# Patient Record
Sex: Male | Born: 1995 | Race: Black or African American | Hispanic: No | Marital: Single | Smoking: Current every day smoker
Health system: Southern US, Community
[De-identification: ages and names within clinical notes are randomized; demographics above are authoritative.]

## PROBLEM LIST (undated history)

## (undated) DIAGNOSIS — Z789 Other specified health status: Secondary | ICD-10-CM

---

## 2018-11-12 ENCOUNTER — Other Ambulatory Visit: Payer: Self-pay

## 2018-11-12 ENCOUNTER — Encounter (HOSPITAL_COMMUNITY): Payer: Self-pay | Admitting: *Deleted

## 2018-11-12 ENCOUNTER — Encounter (HOSPITAL_COMMUNITY): Admission: EM | Disposition: A | Discharge status: Home / Self Care | Payer: Self-pay | Attending: General Surgery

## 2018-11-12 ENCOUNTER — Emergency Department (HOSPITAL_BASED_OUTPATIENT_CLINIC_OR_DEPARTMENT_OTHER)
Admission: EM | Admit: 2018-11-12 | Discharge: 2018-11-12 | Disposition: A | Discharge status: Other Acute Inpatient Hospital | Payer: Worker's Compensation | Source: Home / Self Care | Attending: Emergency Medicine | Admitting: Emergency Medicine

## 2018-11-12 ENCOUNTER — Ambulatory Visit (HOSPITAL_COMMUNITY)
Admission: EM | Admit: 2018-11-12 | Discharge: 2018-11-12 | Disposition: A | Discharge status: Home / Self Care | Payer: Worker's Compensation | Source: Other Acute Inpatient Hospital | Attending: General Surgery | Admitting: General Surgery

## 2018-11-12 ENCOUNTER — Emergency Department (HOSPITAL_BASED_OUTPATIENT_CLINIC_OR_DEPARTMENT_OTHER): Payer: Worker's Compensation

## 2018-11-12 ENCOUNTER — Other Ambulatory Visit: Payer: Self-pay | Admitting: General Surgery

## 2018-11-12 ENCOUNTER — Ambulatory Visit (HOSPITAL_COMMUNITY): Payer: Worker's Compensation | Admitting: Anesthesiology

## 2018-11-12 ENCOUNTER — Encounter (HOSPITAL_BASED_OUTPATIENT_CLINIC_OR_DEPARTMENT_OTHER)
Admission: EM | Disposition: A | Discharge status: Other Acute Inpatient Hospital | Payer: Self-pay | Source: Home / Self Care | Attending: Emergency Medicine

## 2018-11-12 ENCOUNTER — Encounter (HOSPITAL_BASED_OUTPATIENT_CLINIC_OR_DEPARTMENT_OTHER): Payer: Self-pay | Admitting: *Deleted

## 2018-11-12 DIAGNOSIS — Y929 Unspecified place or not applicable: Secondary | ICD-10-CM

## 2018-11-12 DIAGNOSIS — W312XXA Contact with powered woodworking and forming machines, initial encounter: Secondary | ICD-10-CM | POA: Insufficient documentation

## 2018-11-12 DIAGNOSIS — Y999 Unspecified external cause status: Secondary | ICD-10-CM

## 2018-11-12 DIAGNOSIS — W319XXA Contact with unspecified machinery, initial encounter: Secondary | ICD-10-CM | POA: Diagnosis not present

## 2018-11-12 DIAGNOSIS — S62662B Nondisplaced fracture of distal phalanx of right middle finger, initial encounter for open fracture: Secondary | ICD-10-CM

## 2018-11-12 DIAGNOSIS — Y939 Activity, unspecified: Secondary | ICD-10-CM

## 2018-11-12 DIAGNOSIS — S68124A Partial traumatic metacarpophalangeal amputation of right ring finger, initial encounter: Secondary | ICD-10-CM | POA: Diagnosis not present

## 2018-11-12 DIAGNOSIS — F17290 Nicotine dependence, other tobacco product, uncomplicated: Secondary | ICD-10-CM | POA: Diagnosis not present

## 2018-11-12 DIAGNOSIS — M79643 Pain in unspecified hand: Secondary | ICD-10-CM

## 2018-11-12 DIAGNOSIS — S61214A Laceration without foreign body of right ring finger without damage to nail, initial encounter: Secondary | ICD-10-CM | POA: Insufficient documentation

## 2018-11-12 DIAGNOSIS — Z23 Encounter for immunization: Secondary | ICD-10-CM

## 2018-11-12 DIAGNOSIS — S62664B Nondisplaced fracture of distal phalanx of right ring finger, initial encounter for open fracture: Secondary | ICD-10-CM | POA: Insufficient documentation

## 2018-11-12 DIAGNOSIS — S68122A Partial traumatic metacarpophalangeal amputation of right middle finger, initial encounter: Secondary | ICD-10-CM | POA: Insufficient documentation

## 2018-11-12 HISTORY — PX: AMPUTATION: SHX166

## 2018-11-12 HISTORY — DX: Other specified health status: Z78.9

## 2018-11-12 SURGERY — AMPUTATION DIGIT
Anesthesia: Choice | Laterality: Right

## 2018-11-12 SURGERY — AMPUTATION DIGIT
Anesthesia: General | Laterality: Right

## 2018-11-12 MED ORDER — OXYCODONE HCL 5 MG PO TABS: 5.0000 mg | ORAL_TABLET | Freq: Once | ORAL | Status: DC | PRN

## 2018-11-12 MED ORDER — DEXAMETHASONE SODIUM PHOSPHATE 10 MG/ML IJ SOLN
INTRAMUSCULAR | Status: DC | PRN
  Administered 2018-11-12: 5 mg via INTRAVENOUS

## 2018-11-12 MED ORDER — LACTATED RINGERS IV SOLN
INTRAVENOUS | Status: DC
  Administered 2018-11-12: 16:00:00 via INTRAVENOUS

## 2018-11-12 MED ORDER — HYDROCODONE-ACETAMINOPHEN 5-325 MG PO TABS: 1.0000 | ORAL_TABLET | ORAL | 0 refills | Status: AC | PRN

## 2018-11-12 MED ORDER — CHLORHEXIDINE GLUCONATE 4 % EX LIQD: 60.0000 mL | Freq: Once | CUTANEOUS | Status: DC

## 2018-11-12 MED ORDER — ONDANSETRON HCL 4 MG/2ML IJ SOLN
INTRAMUSCULAR | Status: DC | PRN
  Administered 2018-11-12: 4 mg via INTRAVENOUS

## 2018-11-12 MED ORDER — PROMETHAZINE HCL 25 MG/ML IJ SOLN: 6.2500 mg | INTRAMUSCULAR | Status: DC | PRN

## 2018-11-12 MED ORDER — BACITRACIN ZINC 500 UNIT/GM EX OINT
TOPICAL_OINTMENT | CUTANEOUS | Status: AC
  Filled 2018-11-12: qty 28.35

## 2018-11-12 MED ORDER — CEFAZOLIN SODIUM 1 G IJ SOLR
INTRAMUSCULAR | Status: AC
  Filled 2018-11-12: qty 20

## 2018-11-12 MED ORDER — POVIDONE-IODINE 10 % EX SWAB: 2.0000 "application " | Freq: Once | CUTANEOUS | Status: DC

## 2018-11-12 MED ORDER — DEXAMETHASONE SODIUM PHOSPHATE 10 MG/ML IJ SOLN
INTRAMUSCULAR | Status: AC
  Filled 2018-11-12: qty 1

## 2018-11-12 MED ORDER — TETANUS-DIPHTH-ACELL PERTUSSIS 5-2.5-18.5 LF-MCG/0.5 IM SUSP
INTRAMUSCULAR | Status: AC
  Administered 2018-11-12: 0.5 mL via INTRAMUSCULAR
  Filled 2018-11-12: qty 0.5

## 2018-11-12 MED ORDER — EPHEDRINE SULFATE-NACL 50-0.9 MG/10ML-% IV SOSY
PREFILLED_SYRINGE | INTRAVENOUS | Status: DC | PRN
  Administered 2018-11-12: 10 mg via INTRAVENOUS

## 2018-11-12 MED ORDER — LIDOCAINE 2% (20 MG/ML) 5 ML SYRINGE
INTRAMUSCULAR | Status: AC
  Filled 2018-11-12: qty 5

## 2018-11-12 MED ORDER — MEPERIDINE HCL 50 MG/ML IJ SOLN: 6.2500 mg | INTRAMUSCULAR | Status: DC | PRN

## 2018-11-12 MED ORDER — HYDROMORPHONE HCL 1 MG/ML IJ SOLN: 0.2500 mg | INTRAMUSCULAR | Status: DC | PRN

## 2018-11-12 MED ORDER — BUPIVACAINE HCL (PF) 0.5 % IJ SOLN
INTRAMUSCULAR | Status: DC | PRN
  Administered 2018-11-12: 10 mL

## 2018-11-12 MED ORDER — FENTANYL CITRATE (PF) 250 MCG/5ML IJ SOLN
INTRAMUSCULAR | Status: AC
  Filled 2018-11-12: qty 5

## 2018-11-12 MED ORDER — EPHEDRINE 5 MG/ML INJ
INTRAVENOUS | Status: AC
  Filled 2018-11-12: qty 10

## 2018-11-12 MED ORDER — CEFAZOLIN SODIUM-DEXTROSE 1-4 GM/50ML-% IV SOLN
1.0000 g | Freq: Once | INTRAVENOUS | Status: AC
  Administered 2018-11-12: 1 g via INTRAVENOUS
  Filled 2018-11-12: qty 50

## 2018-11-12 MED ORDER — FENTANYL CITRATE (PF) 250 MCG/5ML IJ SOLN
INTRAMUSCULAR | Status: DC | PRN
  Administered 2018-11-12 (×3): 50 ug via INTRAVENOUS

## 2018-11-12 MED ORDER — FENTANYL CITRATE (PF) 100 MCG/2ML IJ SOLN
INTRAMUSCULAR | Status: AC
  Administered 2018-11-12: 100 ug via INTRAVENOUS
  Filled 2018-11-12: qty 2

## 2018-11-12 MED ORDER — PROPOFOL 10 MG/ML IV BOLUS
INTRAVENOUS | Status: DC | PRN
  Administered 2018-11-12: 200 mg via INTRAVENOUS

## 2018-11-12 MED ORDER — CEFAZOLIN SODIUM-DEXTROSE 2-4 GM/100ML-% IV SOLN
2.0000 g | INTRAVENOUS | Status: AC
  Administered 2018-11-12: 2 g via INTRAVENOUS

## 2018-11-12 MED ORDER — FENTANYL CITRATE (PF) 100 MCG/2ML IJ SOLN
50.0000 ug | Freq: Once | INTRAMUSCULAR | Status: AC
  Administered 2018-11-12: 100 ug via INTRAVENOUS

## 2018-11-12 MED ORDER — MIDAZOLAM HCL 2 MG/2ML IJ SOLN
INTRAMUSCULAR | Status: DC | PRN
  Administered 2018-11-12: 2 mg via INTRAVENOUS

## 2018-11-12 MED ORDER — ONDANSETRON HCL 4 MG/2ML IJ SOLN
INTRAMUSCULAR | Status: AC
  Filled 2018-11-12: qty 2

## 2018-11-12 MED ORDER — LIDOCAINE 2% (20 MG/ML) 5 ML SYRINGE
INTRAMUSCULAR | Status: DC | PRN
  Administered 2018-11-12: 50 mg via INTRAVENOUS

## 2018-11-12 MED ORDER — BUPIVACAINE HCL 0.5 % IJ SOLN
INTRAMUSCULAR | Status: AC
  Filled 2018-11-12: qty 1

## 2018-11-12 MED ORDER — CEPHALEXIN 500 MG PO CAPS: 500.0000 mg | ORAL_CAPSULE | Freq: Four times a day (QID) | ORAL | 0 refills | Status: AC

## 2018-11-12 MED ORDER — OXYCODONE HCL 5 MG/5ML PO SOLN: 5.0000 mg | Freq: Once | ORAL | Status: DC | PRN

## 2018-11-12 MED ORDER — MIDAZOLAM HCL 2 MG/2ML IJ SOLN
INTRAMUSCULAR | Status: AC
  Filled 2018-11-12: qty 2

## 2018-11-12 MED ORDER — PROPOFOL 10 MG/ML IV BOLUS
INTRAVENOUS | Status: AC
  Filled 2018-11-12: qty 20

## 2018-11-12 SURGICAL SUPPLY — 36 items
BANDAGE ACE 4X5 VEL STRL LF (GAUZE/BANDAGES/DRESSINGS) ×3 IMPLANT
BNDG COHESIVE 1X5 TAN STRL LF (GAUZE/BANDAGES/DRESSINGS) ×3 IMPLANT
BNDG CONFORM 2 STRL LF (GAUZE/BANDAGES/DRESSINGS) ×3 IMPLANT
BNDG ESMARK 4X9 LF (GAUZE/BANDAGES/DRESSINGS) ×3 IMPLANT
BNDG GAUZE ELAST 4 BULKY (GAUZE/BANDAGES/DRESSINGS) ×3 IMPLANT
CHLORAPREP W/TINT 26ML (MISCELLANEOUS) ×3 IMPLANT
CORDS BIPOLAR (ELECTRODE) ×3 IMPLANT
COVER WAND RF STERILE (DRAPES) ×3 IMPLANT
DRSG ADAPTIC 3X8 NADH LF (GAUZE/BANDAGES/DRESSINGS) ×3 IMPLANT
DRSG XEROFORM 1X8 (GAUZE/BANDAGES/DRESSINGS) ×3 IMPLANT
ELECT REM PT RETURN 9FT ADLT (ELECTROSURGICAL)
ELECTRODE REM PT RTRN 9FT ADLT (ELECTROSURGICAL) IMPLANT
GAUZE SPONGE 4X4 12PLY STRL (GAUZE/BANDAGES/DRESSINGS) ×3 IMPLANT
GAUZE XEROFORM 1X8 LF (GAUZE/BANDAGES/DRESSINGS) ×3 IMPLANT
GLOVE BIOGEL M 8.0 STRL (GLOVE) ×3 IMPLANT
GOWN STRL REUS W/ TWL LRG LVL3 (GOWN DISPOSABLE) ×1 IMPLANT
GOWN STRL REUS W/ TWL XL LVL3 (GOWN DISPOSABLE) ×1 IMPLANT
GOWN STRL REUS W/TWL LRG LVL3 (GOWN DISPOSABLE) ×2
GOWN STRL REUS W/TWL XL LVL3 (GOWN DISPOSABLE) ×2
KIT BASIN OR (CUSTOM PROCEDURE TRAY) ×3 IMPLANT
KIT TURNOVER KIT B (KITS) ×3 IMPLANT
NS IRRIG 1000ML POUR BTL (IV SOLUTION) ×3 IMPLANT
PACK ORTHO EXTREMITY (CUSTOM PROCEDURE TRAY) ×3 IMPLANT
PAD ARMBOARD 7.5X6 YLW CONV (MISCELLANEOUS) ×6 IMPLANT
PAD CAST 4YDX4 CTTN HI CHSV (CAST SUPPLIES) ×1 IMPLANT
PADDING CAST COTTON 4X4 STRL (CAST SUPPLIES) ×2
SOAP 2 % CHG 4 OZ (WOUND CARE) ×3 IMPLANT
SPONGE LAP 18X18 RF (DISPOSABLE) ×3 IMPLANT
SPONGE LAP 4X18 RFD (DISPOSABLE) ×3 IMPLANT
SUT ETHILON 4 0 PS 2 18 (SUTURE) ×6 IMPLANT
TOWEL OR 17X24 6PK STRL BLUE (TOWEL DISPOSABLE) ×3 IMPLANT
TOWEL OR 17X26 10 PK STRL BLUE (TOWEL DISPOSABLE) ×3 IMPLANT
TUBE CONNECTING 12'X1/4 (SUCTIONS)
TUBE CONNECTING 12X1/4 (SUCTIONS) IMPLANT
WATER STERILE IRR 1000ML POUR (IV SOLUTION) ×3 IMPLANT
YANKAUER SUCT BULB TIP NO VENT (SUCTIONS) IMPLANT

## 2018-11-12 NOTE — ED Notes (Signed)
Patient transported to X-ray 

## 2018-11-12 NOTE — Anesthesia Procedure Notes (Signed)
Procedure Name: LMA Insertion Date/Time: 11/12/2018 4:13 PM Performed by: De Nurse, CRNA Pre-anesthesia Checklist: Patient identified, Emergency Drugs available, Suction available and Patient being monitored Patient Re-evaluated:Patient Re-evaluated prior to induction Oxygen Delivery Method: Circle System Utilized Preoxygenation: Pre-oxygenation with 100% oxygen Induction Type: IV induction Ventilation: Mask ventilation without difficulty LMA: LMA inserted LMA Size: 4.0 Number of attempts: 1 Placement Confirmation: positive ETCO2 Tube secured with: Tape Dental Injury: Teeth and Oropharynx as per pre-operative assessment

## 2018-11-12 NOTE — ED Notes (Signed)
Pt needs UDS for work

## 2018-11-12 NOTE — Discharge Instructions (Signed)
Discharge Instructions:  Keep your dressing clean, dry and in place until instructed to remove by Dr. Woodfin Kiss.  If the dressing becomes dirty or wet call the office for instructions during business hours. Elevate the extremity to help with swelling, this will also help with any discomfort. Take your medication as prescribed. No lifting with the injured  extremity. If you feel that the dressing is too tight, you may loosen it, but keep it on; finger tips should be pink; if there is a concern, call the office. [phone] Ice may be used if the injury is a fracture, do not apply ice directly to the skin. Please call the office on the next business day after discharge to arrange a follow up appointment.  Call [phone] between the hours of 9am - 5pm M-Th or 9am - 1pm on Fri. For most hand injuries and/or conditions, you may return to work using the uninjured hand (one handed duty) within 24-72 hours.  A detailed note will be provided to you at your follow up appointment or may contact the office prior to your follow up.    

## 2018-11-12 NOTE — ED Provider Notes (Addendum)
[facility] EMERGENCY DEPARTMENT Provider Note   CSN: [id number] Arrival date & time: 11/12/18  0825    History   Chief Complaint Chief Complaint  Patient presents with  . Laceration    HPI Logan Faulkner is a 23 y.o. male.     The history is provided by the patient and medical records. No language interpreter was used.  Hand Pain  This is a new problem. The current episode started 1 to 2 hours ago. The problem occurs constantly. The problem has not changed since onset.Pertinent negatives include no chest pain, no abdominal pain, no headaches and no shortness of breath. Nothing aggravates the symptoms. Nothing relieves the symptoms. He has tried nothing for the symptoms. The treatment provided no relief.    History reviewed. No pertinent past medical history.  There are no active problems to display for this patient.   History reviewed. No pertinent surgical history.      Home Medications    Prior to Admission medications   Not on File    Family History No family history on file.  Social History Social History   Tobacco Use  . Smoking status: Not on file  Substance Use Topics  . Alcohol use: Not on file  . Drug use: Not on file     Allergies   Patient has no known allergies.   Review of Systems Review of Systems  Constitutional: Negative for activity change, chills, diaphoresis, fatigue and fever.  HENT: Negative for congestion and rhinorrhea.   Eyes: Negative for visual disturbance.  Respiratory: Negative for cough, chest tightness, shortness of breath, wheezing and stridor.   Cardiovascular: Negative for chest pain, palpitations and leg swelling.  Gastrointestinal: Negative for abdominal distention, abdominal pain, blood in stool, constipation, diarrhea, nausea and vomiting.  Genitourinary: Negative for difficulty urinating, dysuria, flank pain and frequency.  Musculoskeletal: Negative for back pain, gait problem, neck pain and neck  stiffness.  Skin: Positive for wound. Negative for rash.  Neurological: Negative for dizziness, weakness, light-headedness, numbness and headaches.  Psychiatric/Behavioral: Negative for agitation.  All other systems reviewed and are negative.    Physical Exam Updated Vital Signs BP (!) 162/108 (BP Location: Left Arm)   Pulse 61   Temp 97.7 F (36.5 C) (Oral)   Resp 16   Ht 6' (1.829 m)   Wt 68 kg   SpO2 100%   BMI 20.34 kg/m   Physical Exam Vitals signs and nursing note reviewed.  Constitutional:      Appearance: He is well-developed.  HENT:     Head: Normocephalic and atraumatic.     Nose: No congestion or rhinorrhea.     Mouth/Throat:     Pharynx: No oropharyngeal exudate or posterior oropharyngeal erythema.  Eyes:     Conjunctiva/sclera: Conjunctivae normal.     Pupils: Pupils are equal, round, and reactive to light.  Neck:     Musculoskeletal: Neck supple.  Cardiovascular:     Rate and Rhythm: Normal rate and regular rhythm.     Heart sounds: No murmur.  Pulmonary:     Effort: Pulmonary effort is normal. No respiratory distress.     Breath sounds: Normal breath sounds.  Abdominal:     Palpations: Abdomen is soft.     Tenderness: There is no abdominal tenderness.  Musculoskeletal:        General: Tenderness present. No swelling.     Right hand: He exhibits tenderness, deformity and laceration. Normal sensation noted. Normal strength noted.  Hands:     Right lower leg: No edema.     Left lower leg: No edema.  Skin:    General: Skin is warm and dry.     Capillary Refill: Capillary refill takes less than 2 seconds.     Findings: No erythema.  Neurological:     General: No focal deficit present.     Mental Status: He is alert and oriented to person, place, and time.           ED Treatments / Results  Labs (all labs ordered are listed, but only abnormal results are displayed) Labs Reviewed - No data to display  EKG None  Radiology Dg Hand  Complete Right  Result Date: 11/12/2018 CLINICAL DATA:  Pt chopped off tip of the 3rd and 4th fingers today in a wood chopper EXAM: RIGHT HAND - COMPLETE 3+ VIEW COMPARISON:  None. FINDINGS: There has been amputation of the distal aspect of the third digit, transecting the distal phalanx. There has been amputation of the soft tissues and probably the distal tuft of the fourth distal phalanx. IMPRESSION: Amputation of the distal aspect of the third and fourth digits. Electronically Signed   By: Norva Pavlov M.D.   On: 11/12/2018 09:29    Procedures Procedures (including critical care time)  Medications Ordered in ED Medications  fentaNYL (SUBLIMAZE) injection 50 mcg (100 mcg Intravenous Given 11/12/18 0929)  Tdap (BOOSTRIX) 5-2.5-18.5 LF-MCG/0.5 injection (0.5 mLs Intramuscular Given 11/12/18 0932)  ceFAZolin (ANCEF) IVPB 1 g/50 mL premix (0 g Intravenous Stopped 11/12/18 1015)     Initial Impression / Assessment and Plan / ED Course  I have reviewed the triage vital signs and the nursing notes.  Pertinent labs & imaging results that were available during my care of the patient were reviewed by me and considered in my medical decision making (see chart for details).        Logan Faulkner is a right handed 23 y.o. male with no significant past medical history who presents with right finger injury.  Patient reports that he was using a chop saw today when there was a piece of wood stuck in the saw.  He reports that he typically would use another piece of wood to dislodge it but this always off so he reached in with his right hand.  He is right-handed.  He then reports he bumped the on button with his body causing the sought to engage and sliced off the tip of his third and fourth digit on the right hand.  He reports immediate onset of bleeding that was stopped with pressure.  He reports severe pain.  No other injuries reported.  No other preceding symptoms.  He was unable to recover the tips of his  fingers.  On exam, lungs clear chest nontender.  Abdomen nontender.  Patient has laceration and tip amputation of the third and fourth digits on the right hand.  Patient had normal sensation in the rest of the finger.  Minimal tenderness in the rest of the hand.  No snuffbox tenderness.  Normal radial and ulnar pulse.  Exam otherwise unremarkable.  There did appear to be exposed bone in the digits.  X-rays were obtained and patient was given tetanus update and Ancef for likely open fracture.  X-rays show amputation as well as open fracture and transection of the distal phalanx of the third digit and probable distal tuft of the fourth digit distal phalanx.  Patient was given pain medicine for severe pain.  Dr. Izora Ribas with hand surgery was called and he request patient go  to [facility] to the short stay area to have surgery this afternoon.  As patient has received narcotic pain medication, do not feel he will be appropriate for driving himself over there.  Will discuss transfer options with patient.  Patient has someone that can give him a ride.  Patient will remain n.p.o. and will go straight to the short stay area, and without our transfer service.  Patient understood he needs to go directly there.  Patient understood return precautions and follow-up instructions.  Patient transferred in stable condition by personal vehicle.   Final Clinical Impressions(s) / ED Diagnoses   Final diagnoses:  Laceration of right ring finger without foreign body, nail damage status unspecified, initial encounter  Contact with powered saw as cause of accidental injury  Open nondisplaced fracture of distal phalanx of right middle finger, initial encounter  Open nondisplaced fracture of distal phalanx of right ring finger, initial encounter    Clinical Impression: 1. Laceration of right ring finger without foreign body, nail damage status unspecified, initial encounter   2. Hand pain   3. Contact with  powered saw as cause of accidental injury   4. Open nondisplaced fracture of distal phalanx of right middle finger, initial encounter   5. Open nondisplaced fracture of distal phalanx of right ring finger, initial encounter     Disposition: Transfer to [facility] short stay for operative management of right hand laceration.  Accepting physician is Dr. Izora Ribas in the short stay area.   This note was prepared with assistance of Conservation officer, historic buildings. Occasional wrong-word or sound-a-like substitutions may have occurred due to the inherent limitations of voice recognition software.     Dmauri Rosenow, Canary Brim, MD 11/12/18 1120    Odel Schmid, Canary Brim, MD 11/12/18 1124

## 2018-11-12 NOTE — Anesthesia Postprocedure Evaluation (Signed)
Anesthesia Post Note  Patient: Logan Faulkner  Procedure(s) Performed: REVISION OF AMPUTATION RIGHT 3RD & 4TH DGIT (Right )     Patient location during evaluation: PACU Anesthesia Type: General Level of consciousness: awake and alert Pain management: pain level controlled Vital Signs Assessment: post-procedure vital signs reviewed and stable Respiratory status: spontaneous breathing, nonlabored ventilation, respiratory function stable and patient connected to nasal cannula oxygen Cardiovascular status: blood pressure returned to baseline and stable Postop Assessment: no apparent nausea or vomiting Anesthetic complications: no    Last Vitals:  Vitals:   11/12/18 1735 11/12/18 1741  BP: (!) 137/91 134/85  Pulse: 66 (!) 55  Resp: (!) 23   Temp: (!) 36.4 C   SpO2: 100% 100%    Last Pain:  Vitals:   11/12/18 1741  TempSrc:   PainSc: 0-No pain                 Ronelle Michie COKER

## 2018-11-12 NOTE — H&P (Signed)
Reason for Consult:finger injury Referring Physician: ER  CC:I cut my ingers off  HPI:  Logan Faulkner is an 23 y.o. right handed male who presents with   Finger tip amputation of RLF, RRF from a chopping device at work.  Pt stuck hand into the machine, accidentally turned it on and finger tips were amputated.  Does not have finger tips.  Pt presented to ER, transferred here for OR     .   Pain is rated at    9/10 and is described as sharp.  Pain is constant.  Pain is made better by rest/immobilization, worse with motion.   Associated signs/symptoms:none Previous treatment:    Past Medical History:  Diagnosis Date  . Medical history non-contributory     History reviewed. No pertinent surgical history.  History reviewed. No pertinent family history.  Social History:  reports that he has been smoking cigars. He has never used smokeless tobacco. He reports previous alcohol use. He reports current drug use. Drug: Marijuana.  Allergies: No Known Allergies  Medications: I have reviewed the patient's current medications.  No results found for this or any previous visit (from the past 48 hour(s)).  Dg Hand Complete Right  Result Date: 11/12/2018 CLINICAL DATA:  Pt chopped off tip of the 3rd and 4th fingers today in a wood chopper EXAM: RIGHT HAND - COMPLETE 3+ VIEW COMPARISON:  None. FINDINGS: There has been amputation of the distal aspect of the third digit, transecting the distal phalanx. There has been amputation of the soft tissues and probably the distal tuft of the fourth distal phalanx. IMPRESSION: Amputation of the distal aspect of the third and fourth digits. Electronically Signed   By: Norva Pavlov M.D.   On: 11/12/2018 09:29    Pertinent items are noted in HPI. Temp:  [97.7 F (36.5 C)-98.4 F (36.9 C)] 98.4 F (36.9 C) (03/02 1303) Pulse Rate:  [61-90] 62 (03/02 1303) Resp:  [16-18] 18 (03/02 1303) BP: (155-165)/(91-108) 165/97 (03/02 1328) SpO2:  [99 %-100 %] 100 %  (03/02 1303) Weight:  [68 kg] 68 kg (03/02 1325) General appearance: alert and cooperative Resp: clear to auscultation bilaterally Cardio: regular rate and rhythm GI: soft, non-tender; bowel sounds normal; no masses,  no organomegaly Extremities: extremities normal, atraumatic, no cyanosis or edema Ex ept for amputation at dipj of RLF, amputation thru nail matrix RRF  Assessment: Amputation of RRF,RLF tips Plan: Will need revision amputations I have discussed this treatment plan in detail with patient , including the risks of the recommended treatment or surgery, the benefits and the alternatives.  The patient caregiver understands that additional treatment may be necessary.  Ved Martos C Parisha Beaulac 11/12/2018, 4:11 PM

## 2018-11-12 NOTE — Anesthesia Preprocedure Evaluation (Signed)
Anesthesia Evaluation  Patient identified by MRN, date of birth, ID band Patient awake    Reviewed: Allergy & Precautions, NPO status , Patient's Chart, lab work & pertinent test results  Airway Mallampati: I  TM Distance: >3 FB Neck ROM: Full    Dental  (+) Dental Advisory Given, Teeth Intact   Pulmonary neg pulmonary ROS,    Pulmonary exam normal breath sounds clear to auscultation       Cardiovascular negative cardio ROS Normal cardiovascular exam Rhythm:Regular Rate:Normal     Neuro/Psych negative neurological ROS  negative psych ROS   GI/Hepatic negative GI ROS, Neg liver ROS,   Endo/Other  negative endocrine ROS  Renal/GU negative Renal ROS     Musculoskeletal negative musculoskeletal ROS (+)   Abdominal   Peds  Hematology negative hematology ROS (+)   Anesthesia Other Findings   Reproductive/Obstetrics                             Anesthesia Physical Anesthesia Plan  ASA: I  Anesthesia Plan: General   Post-op Pain Management:    Induction: Intravenous  PONV Risk Score and Plan: 3 and Ondansetron, Dexamethasone, Midazolam and Treatment may vary due to age or medical condition  Airway Management Planned: LMA and Oral ETT  Additional Equipment: None  Intra-op Plan:   Post-operative Plan: Extubation in OR  Informed Consent: I have reviewed the patients History and Physical, chart, labs and discussed the procedure including the risks, benefits and alternatives for the proposed anesthesia with the patient or authorized representative who has indicated his/her understanding and acceptance.     Dental advisory given  Plan Discussed with: CRNA  Anesthesia Plan Comments:         Anesthesia Quick Evaluation

## 2018-11-12 NOTE — Transfer of Care (Signed)
Immediate Anesthesia Transfer of Care Note  Patient: Logan Faulkner  Procedure(s) Performed: REVISION OF AMPUTATION RIGHT 3RD & 4TH DGIT (Right )  Patient Location: PACU  Anesthesia Type:General  Level of Consciousness: lethargic and responds to stimulation  Airway & Oxygen Therapy: Patient Spontanous Breathing  Post-op Assessment: Report given to RN  Post vital signs: Reviewed and stable  Last Vitals:  Vitals Value Taken Time  BP    Temp    Pulse 66 11/12/2018  5:04 PM  Resp 23 11/12/2018  5:05 PM  SpO2 100 % 11/12/2018  5:04 PM  Vitals shown include unvalidated device data.  Last Pain:  Vitals:   11/12/18 1320  TempSrc:   PainSc: 4       Patients Stated Pain Goal: 3 (11/12/18 1320)  Complications: No apparent anesthesia complications

## 2018-11-12 NOTE — ED Triage Notes (Signed)
Tips of 3rd and 4th finger rt hand cut w chop swaw  Bleeding controlled

## 2018-11-13 ENCOUNTER — Encounter (HOSPITAL_COMMUNITY): Payer: Self-pay | Admitting: General Surgery

## 2018-11-13 NOTE — Op Note (Signed)
NAME: YSMAEL, SALINGER MEDICAL RECORD EX:[id number] ACCOUNT [id number] DATE OF BIRTH:03/25/1996 FACILITY: [facility] LOCATION: [facility] PHYSICIAN:Eldridge Marcott C. Gwen Edler, MD  OPERATIVE REPORT  DATE OF PROCEDURE:  11/12/2018  PREOPERATIVE DIAGNOSIS:  Fingertip amputation of the right long finger and right ring finger.  POSTOPERATIVE DIAGNOSIS:  Fingertip amputation of the right long finger and right ring finger.  PROCEDURE:   1.  Revision amputation of the right ring finger with neurectomies and local advancement flap closure. 2.  A preparation of the right ring finger for full thickness skin graft. 3.  Harvest of full-thickness skin graft from the right ulnar mid lateral hand. 4.  Placement of full thickness skin graft to defect of the right ring fingertip.  INDICATIONS:  The patient is a 23 year old gentleman who had his finger was amputated at work this afternoon with some type of chopping machine.  Risks, benefits and alternatives of revision amputation were discussed with the patient who agreed with  these.  Consent was obtained.  DESCRIPTION OF PROCEDURE:  The patient was taken to the operating room and placed supine on the operating room table.  Anesthesia was administered.  Timeout was performed.  Preoperative antibiotics were given.  The right upper extremity was prepped and  draped in normal sterile fashion.  An Esmarch was used on the forearm for a tourniquet.  The long finger was addressed first.  The amputation was just distal to the nail fold and distal to the DIP joint.  The wound was thoroughly cleansed with saline  solution.  The skin edges were trimmed.  Full thickness skin, subcutaneous tissue, bone were debrided neurectomies were performed.  Hemostasis was performed with bipolar cautery.  The volar part of the flap was rotated dorsally and sutured in place with  multiple interrupted 4-0 nylon sutures for a nice repair.  Next, the ring finger was addressed.  The defect was  approximately 1.2 cm x 1.  The very distal portion of the tuft had been amputated, but otherwise, there was good soft tissue coverage and most  of the nail matrix was intact.  The skin and subcutaneous tissue was sharply debrided.  Bipolar was used to control bleeding.  A small elliptical pattern was chosen on the ulnar side of the mid lateral portion of the hand.  A 1.5 cm x 1 cm ellipse was  made.  Full thickness skin was harvested.  Hemostasis was obtained.  This defect was gently undermined and closed tension-free with multiple interrupted 4-0 nylon sutures.  The graft was defatted, pie crusted and placed over the defect and sutured  circumferentially with a 4-0 nylon for a nice repair.  The Esmarch was released.  Hemostasis was complete.  Sterile dressings were placed over each finger as well as the graft donor site.    The patient tolerated the procedure well.  AN/NUANCE  D:11/12/2018 T:11/13/2018 JOB:[id number]

## 2021-04-06 ENCOUNTER — Emergency Department (HOSPITAL_BASED_OUTPATIENT_CLINIC_OR_DEPARTMENT_OTHER): Payer: Self-pay

## 2021-04-06 ENCOUNTER — Emergency Department (HOSPITAL_BASED_OUTPATIENT_CLINIC_OR_DEPARTMENT_OTHER)
Admission: EM | Admit: 2021-04-06 | Discharge: 2021-04-06 | Disposition: A | Discharge status: Home / Self Care | Payer: Self-pay | Attending: Emergency Medicine | Admitting: Emergency Medicine

## 2021-04-06 ENCOUNTER — Other Ambulatory Visit: Payer: Self-pay

## 2021-04-06 ENCOUNTER — Encounter (HOSPITAL_BASED_OUTPATIENT_CLINIC_OR_DEPARTMENT_OTHER): Payer: Self-pay | Admitting: *Deleted

## 2021-04-06 DIAGNOSIS — F12188 Cannabis abuse with other cannabis-induced disorder: Secondary | ICD-10-CM | POA: Insufficient documentation

## 2021-04-06 DIAGNOSIS — Z79899 Other long term (current) drug therapy: Secondary | ICD-10-CM | POA: Insufficient documentation

## 2021-04-06 DIAGNOSIS — F1290 Cannabis use, unspecified, uncomplicated: Secondary | ICD-10-CM

## 2021-04-06 DIAGNOSIS — F17290 Nicotine dependence, other tobacco product, uncomplicated: Secondary | ICD-10-CM | POA: Insufficient documentation

## 2021-04-06 DIAGNOSIS — R112 Nausea with vomiting, unspecified: Secondary | ICD-10-CM

## 2021-04-06 LAB — RAPID URINE DRUG SCREEN, HOSP PERFORMED
Amphetamines: NOT DETECTED
Barbiturates: NOT DETECTED
Benzodiazepines: NOT DETECTED
Cocaine: NOT DETECTED
Opiates: NOT DETECTED
Tetrahydrocannabinol: POSITIVE — AB

## 2021-04-06 LAB — COMPREHENSIVE METABOLIC PANEL
ALT: 15 U/L (ref 0–44)
AST: 25 U/L (ref 15–41)
Albumin: 4.9 g/dL (ref 3.5–5.0)
Alkaline Phosphatase: 109 U/L (ref 38–126)
Anion gap: 3 — ABNORMAL LOW (ref 5–15)
BUN: 13 mg/dL (ref 6–20)
CO2: 23 mmol/L (ref 22–32)
Calcium: 8.4 mg/dL — ABNORMAL LOW (ref 8.9–10.3)
Chloride: 106 mmol/L (ref 98–111)
Creatinine, Ser: 1.06 mg/dL (ref 0.61–1.24)
GFR, Estimated: >60 mL/min (ref >60)
Glucose, Bld: 97 mg/dL (ref 70–99)
Potassium: 3.3 mmol/L — ABNORMAL LOW (ref 3.5–5.1)
Sodium: 132 mmol/L — ABNORMAL LOW (ref 135–145)
Total Bilirubin: 1.3 mg/dL — ABNORMAL HIGH (ref 0.3–1.2)
Total Protein: 7.9 g/dL (ref 6.5–8.1)

## 2021-04-06 LAB — CBC
HCT: 40.6 % (ref 39.0–52.0)
Hemoglobin: 13.9 g/dL (ref 13.0–17.0)
MCH: 32.3 pg (ref 26.0–34.0)
MCHC: 34.2 g/dL (ref 30.0–36.0)
MCV: 94.2 fL (ref 80.0–100.0)
Platelets: 243 10*3/uL (ref 150–400)
RBC: 4.31 MIL/uL (ref 4.22–5.81)
RDW: 12 % (ref 11.5–15.5)
WBC: 8.2 10*3/uL (ref 4.0–10.5)
nRBC: 0 % (ref 0.0–0.2)

## 2021-04-06 LAB — OCCULT BLOOD X 1 CARD TO LAB, STOOL: Fecal Occult Bld: NEGATIVE

## 2021-04-06 LAB — LACTIC ACID, PLASMA
Lactic Acid, Venous: 2 mmol/L (ref 0.5–1.9)
Lactic Acid, Venous: 1.6 mmol/L (ref 0.5–1.9)

## 2021-04-06 LAB — OCCULT BLOOD GASTRIC / DUODENUM (SPECIMEN CUP): Occult Blood, Gastric: POSITIVE — AB

## 2021-04-06 MED ORDER — ONDANSETRON HCL 4 MG/2ML IJ SOLN
4.0000 mg | Freq: Once | INTRAMUSCULAR | Status: AC
  Administered 2021-04-06: 4 mg via INTRAVENOUS
  Filled 2021-04-06: qty 2

## 2021-04-06 MED ORDER — PANTOPRAZOLE SODIUM 40 MG IV SOLR
40.0000 mg | Freq: Once | INTRAVENOUS | Status: AC
  Administered 2021-04-06: 40 mg via INTRAVENOUS
  Filled 2021-04-06: qty 40

## 2021-04-06 MED ORDER — SODIUM CHLORIDE 0.9 % IV BOLUS
1000.0000 mL | Freq: Once | INTRAVENOUS | Status: AC
  Administered 2021-04-06: 1000 mL via INTRAVENOUS

## 2021-04-06 MED ORDER — HALOPERIDOL LACTATE 5 MG/ML IJ SOLN
2.0000 mg | Freq: Once | INTRAMUSCULAR | Status: AC
  Administered 2021-04-06: 2 mg via INTRAVENOUS
  Filled 2021-04-06: qty 1

## 2021-04-06 MED ORDER — ONDANSETRON 4 MG PO TBDP: 4.0000 mg | ORAL_TABLET | Freq: Three times a day (TID) | ORAL | 0 refills | Status: AC | PRN

## 2021-04-06 MED ORDER — DIPHENHYDRAMINE HCL 50 MG/ML IJ SOLN
25.0000 mg | Freq: Once | INTRAMUSCULAR | Status: AC
  Administered 2021-04-06: 25 mg via INTRAVENOUS
  Filled 2021-04-06: qty 1

## 2021-04-06 MED ORDER — IOHEXOL 300 MG/ML  SOLN
100.0000 mL | Freq: Once | INTRAMUSCULAR | Status: AC | PRN
  Administered 2021-04-06: 100 mL via INTRAVENOUS

## 2021-04-06 MED ORDER — ONDANSETRON 4 MG PO TBDP
4.0000 mg | ORAL_TABLET | Freq: Once | ORAL | Status: AC
  Administered 2021-04-06: 4 mg via ORAL
  Filled 2021-04-06: qty 1

## 2021-04-06 NOTE — ED Notes (Signed)
IV Zofran helped w/vomiting for about and hour then vomiting returned. Haldol, Benadryl given. Pt. Now resting comfortably.

## 2021-04-06 NOTE — ED Notes (Signed)
IV Zofran helped w/

## 2021-04-06 NOTE — ED Notes (Signed)
Pt tolerating PO, able to eat pretzels and drink water without vomiting/nausea

## 2021-04-06 NOTE — ED Notes (Addendum)
Pt left prior to receiving paper work. Pt's IV was not removed by staff prior to leaving. Attempted to call pt to return to ED, pt did not answer.

## 2021-04-06 NOTE — ED Notes (Signed)
Patient transported to CT 

## 2021-04-06 NOTE — ED Triage Notes (Signed)
Vomited bright red blood x 3 this am. Black stools x 2 days.

## 2021-04-06 NOTE — ED Notes (Signed)
Pt asking if he can eat.

## 2021-04-06 NOTE — ED Notes (Signed)
2nd lactic collected at [facility]

## 2021-04-06 NOTE — ED Provider Notes (Signed)
[facility] EMERGENCY DEPARTMENT Provider Note   CSN: [id number] Arrival date & time: 04/06/21  1205     History Chief Complaint  Patient presents with   GI Bleeding    Logan Faulkner is a 25 y.o. male.  25 year old male with no significant past medical history presents with complaint of black watery stools for the past 2 days with vomiting bright red emesis x3 episodes this morning.  Also reports epigastric abdominal pain.  Denies NSAID use, history of peptic ulcer disease.  Patient is an occasional drinker, states he drinks 1 day/week, does smoke marijuana.  No known sick contacts, no recent antibiotics, no recent travel.  Denies taking Pepto-Bismol, iron supplement.  Reports feeling chilled with sweats today, no fevers.      Past Medical History:  Diagnosis Date   Medical history non-contributory     There are no problems to display for this patient.   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  Social History   Tobacco Use   Smoking status: Every Day    Types: Cigars   Smokeless tobacco: Never   Tobacco comments:    1 daily  Vaping Use   Vaping Use: Never used  Substance Use Topics   Alcohol use: Yes   Drug use: Yes    Types: Marijuana    Comment: 2 joints a gday    Home Medications Prior to Admission medications   Medication Sig Start Date End Date Taking? Authorizing Provider  ondansetron (ZOFRAN ODT) 4 MG disintegrating tablet Take 1 tablet (4 mg total) by mouth every 8 (eight) hours as needed for nausea or vomiting. 04/06/21  Yes Jeannie Fend, PA-C  cephALEXin (KEFLEX) 500 MG capsule Take 1 capsule (500 mg total) by mouth 4 (four) times daily. 11/12/18   Knute Neu, MD    Allergies    Patient has no known  allergies.  Review of Systems   Review of Systems  Constitutional:  Positive for chills and diaphoresis. Negative for fever.  Respiratory:  Positive for shortness of breath.   Cardiovascular:  Negative for chest pain.  Gastrointestinal:  Positive for abdominal pain, blood in stool, diarrhea, nausea and vomiting. Negative for constipation.  Genitourinary:  Negative for difficulty urinating.  Musculoskeletal:  Negative for arthralgias and myalgias.  Skin:  Negative for rash and wound.  Allergic/Immunologic: Negative for immunocompromised state.  Neurological:  Positive for weakness.  Hematological:  Does not bruise/bleed easily.  Psychiatric/Behavioral:  Negative for confusion.   All other systems reviewed and are negative.  Physical Exam Updated Vital Signs BP (!) 155/81 (BP Location: Right Arm)   Pulse (!) 46   Temp (S) 97.9 F (36.6 C)   Resp 15   Ht 6\' 2"  (1.88 m)   Wt 61.2 kg   SpO2 100%   BMI 17.33 kg/m   Physical Exam Vitals and nursing note reviewed. Exam conducted with a chaperone present.  Constitutional:      General: He is not in acute distress.    Appearance: He is well-developed. He is diaphoretic.  HENT:     Head: Normocephalic and atraumatic.     Mouth/Throat:     Mouth: Mucous membranes are moist.  Eyes:     Conjunctiva/sclera: Conjunctivae normal.  Cardiovascular:     Rate and Rhythm: Normal rate and regular rhythm.     Pulses: Normal pulses.     Heart sounds: Normal heart sounds.  Pulmonary:     Effort: Pulmonary effort is normal.     Breath sounds: Normal breath sounds.  Abdominal:     Palpations: Abdomen is soft.     Tenderness: There is abdominal tenderness in the epigastric area.  Genitourinary:    Rectum: Guaiac result negative.     Comments: Scant amount of black stool present which is occult negative Musculoskeletal:     Cervical back: Neck supple.     Right lower leg: No edema.     Left lower leg: No edema.  Skin:    General: Skin is  warm.     Findings: No erythema or rash.  Neurological:     Mental Status: He is alert and oriented to person, place, and time.  Psychiatric:        Behavior: Behavior normal.    ED Results / Procedures / Treatments   Labs (all labs ordered are listed, but only abnormal results are displayed) Labs Reviewed  COMPREHENSIVE METABOLIC PANEL - Abnormal; Notable for the following components:      Result Value   Sodium 132 (*)    Potassium 3.3 (*)    Calcium 8.4 (*)    Total Bilirubin 1.3 (*)    Anion gap 3 (*)    All other components within normal limits  LACTIC ACID, PLASMA - Abnormal; Notable for the following components:   Lactic Acid, Venous 2.0 (*)    All other components within normal limits  OCCULT BLOOD GASTRIC / DUODENUM (SPECIMEN CUP) - Abnormal; Notable for the following components:   Occult Blood, Gastric POSITIVE (*)    All other components within normal limits  RAPID URINE DRUG SCREEN, HOSP PERFORMED - Abnormal; Notable for the following components:   Tetrahydrocannabinol POSITIVE (*)    All other components within normal limits  CBC  OCCULT BLOOD X 1 CARD TO LAB, STOOL  LACTIC ACID, PLASMA    EKG None  Radiology CT Abdomen Pelvis W Contrast  Result Date: 04/06/2021 CLINICAL DATA:  25 year old with abdominal pain, acute, nonlocalized. Vomiting bright red blood. EXAM: CT ABDOMEN AND PELVIS WITH CONTRAST TECHNIQUE: Multidetector CT imaging of the abdomen and pelvis was performed using the standard protocol following bolus administration of intravenous contrast. CONTRAST:  OMNIPAQUE IOHEXOL 300 MG/ML  SOLN COMPARISON:  None. FINDINGS: Lower chest: Lung bases are clear. Hepatobiliary: Normal appearance of the liver and gallbladder. No biliary dilatation. Pancreas: Unremarkable. No pancreatic ductal dilatation or surrounding inflammatory changes. Spleen: Normal in size without focal abnormality. Adrenals/Urinary Tract: Normal adrenal glands. Normal appearance of both  kidneys. Normal appearance of the urinary bladder. Stomach/Bowel: Normal appearance of the stomach. There is no evidence for acute bowel inflammation or obstruction. Difficult to evaluate the bowel structures due to the lack of intra-abdominal fat. Few fluid-filled loops of small bowel in the upper pelvis. Vascular/Lymphatic: No abdominal or pelvic lymphadenopathy. Arterial structures are normal. This is a predominantly arterial phase examination. Limited evaluation of the venous structures. Reproductive: Prostate is unremarkable. Other: Trace free fluid in the pelvis.  Negative for free air. Musculoskeletal: No acute bone abnormality. IMPRESSION: 1. Trace fluid in the pelvis. Findings are nonspecific. Underlying inflammatory process cannot be excluded. No focal areas of acute inflammation in the abdomen or pelvis. Electronically Signed   By: Richarda Overlie M.D.   On: 04/06/2021  17:13    Procedures Procedures   Medications Ordered in ED Medications  ondansetron (ZOFRAN-ODT) disintegrating tablet 4 mg (4 mg Oral Given 04/06/21 1520)  sodium chloride 0.9 % bolus 1,000 mL (0 mLs Intravenous Stopped 04/06/21 1922)  ondansetron (ZOFRAN) injection 4 mg (4 mg Intravenous Given 04/06/21 1616)  iohexol (OMNIPAQUE) 300 MG/ML solution 100 mL (100 mLs Intravenous Contrast Given 04/06/21 1643)  sodium chloride 0.9 % bolus 1,000 mL (0 mLs Intravenous Stopped 04/06/21 1922)  haloperidol lactate (HALDOL) injection 2 mg (2 mg Intravenous Given 04/06/21 1726)  diphenhydrAMINE (BENADRYL) injection 25 mg (25 mg Intravenous Given 04/06/21 1725)  pantoprazole (PROTONIX) injection 40 mg (40 mg Intravenous Given 04/06/21 1745)    ED Course  I have reviewed the triage vital signs and the nursing notes.  Pertinent labs & imaging results that were available during my care of the patient were reviewed by me and considered in my medical decision making (see chart for details).  Clinical Course as of 04/06/21 2018  Tue Apr 06, 2021   [id number] 25 year old male presents emergency room with concern for vomiting bright red blood as well as black liquid stools and abdominal pain.  On exam, patient is diaphoretic although otherwise well-appearing.  His vitals show he is mildly hypertensive although maintaining room air sats at 100%, he is not tachycardic.  Found to have epigastric tenderness.  His Hemoccult is negative his Gastroccult is positive, he was given Protonix.  Patient initial lactic acid is mildly elevated at 2.0, likely due to his vomiting, repeat after IV fluids is improved to 1.6.  CBC is unremarkable, specifically his H&H are within normal limits.  CMP with mild hypokalemia with potassium of 3.3, should resolve, but he is no longer vomiting. Gap of 3 likely secondary to vomiting.  UDS is positive for marijuana.  Patient was given Haldol for his vomiting, vomiting has resolved and he is tolerating oral fluids and pretzels. [LM]  2017 Informed by nursing staff, patient has left without his discharge papers. [LM]    Clinical Course User Index [LM] Alden Hipp   MDM Rules/Calculators/A&P                           Final Clinical Impression(s) / ED Diagnoses Final diagnoses:  Cannabinoid hyperemesis syndrome    Rx / DC Orders ED Discharge Orders          Ordered    ondansetron (ZOFRAN ODT) 4 MG disintegrating tablet  Every 8 hours PRN        04/06/21 2015             Jeannie Fend, PA-C 04/06/21 2018    Long, Arlyss Repress, MD 04/12/21 1254

## 2021-04-07 NOTE — ED Notes (Signed)
Pt arrives to pick up AVS after leaving before receiving it yesterday. Pt reports removing own IV, site checked by this RN, no IV, no bleeding.

## 2023-03-09 IMAGING — CT CT ABD-PELV W/ CM
2 of 4 series · 16 of 46 positions shown, 18 images · IV contrast (Omnipaque)
Comparison: None.

CLINICAL DATA: 25-year-old with abdominal pain, acute,
nonlocalized. Vomiting bright red blood.

EXAM:
CT ABDOMEN AND PELVIS WITH CONTRAST
TECHNIQUE: Multidetector CT imaging of the abdomen and pelvis was performed
using the standard protocol following bolus administration of
intravenous contrast.
CONTRAST:  100mL OMNIPAQUE IOHEXOL 300 MG/ML  SOLN

[Series 2: axial st · axial · 0.69mm/px · z∈[+1052,+1447]mm · 13 of 87 slices shown, 15 images]
[im 4/87  soft-tissue]
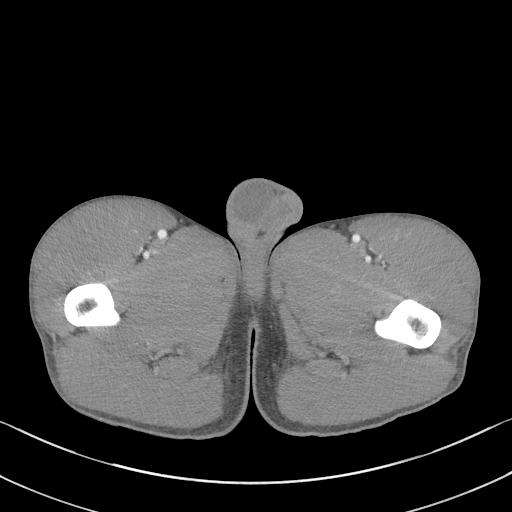
[im 4/87  bone]
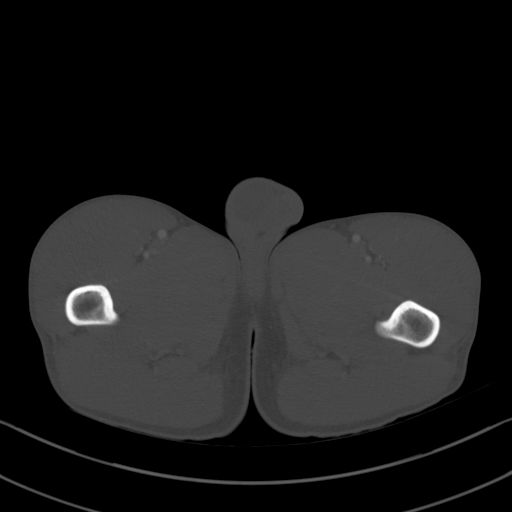
[im 11/87  soft-tissue]
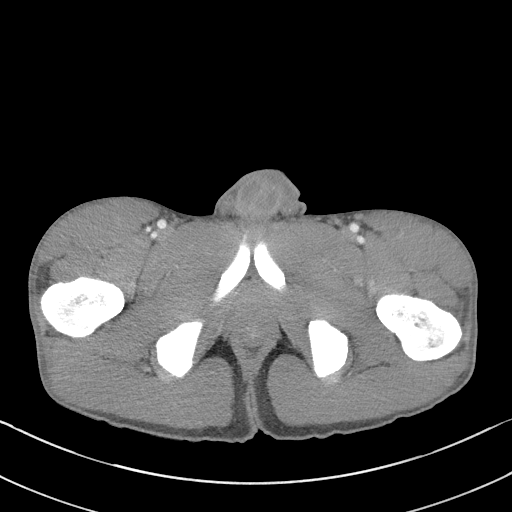
[im 18/87  soft-tissue]
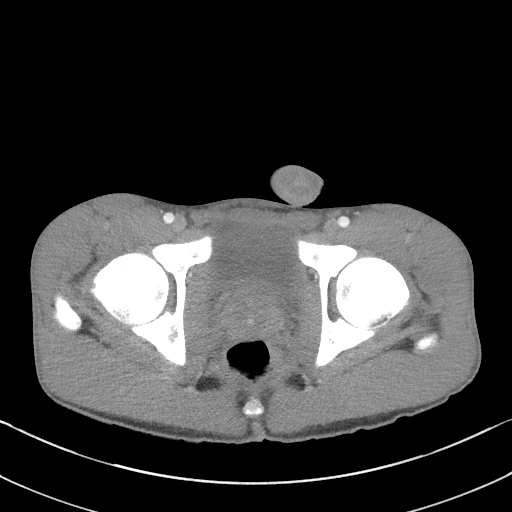
[im 25/87  soft-tissue]
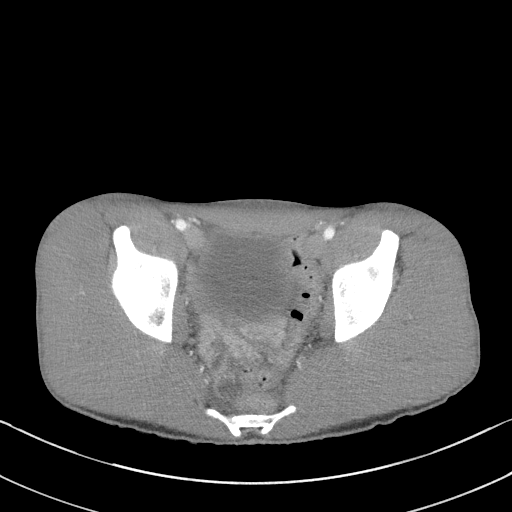
[im 31/87  soft-tissue]
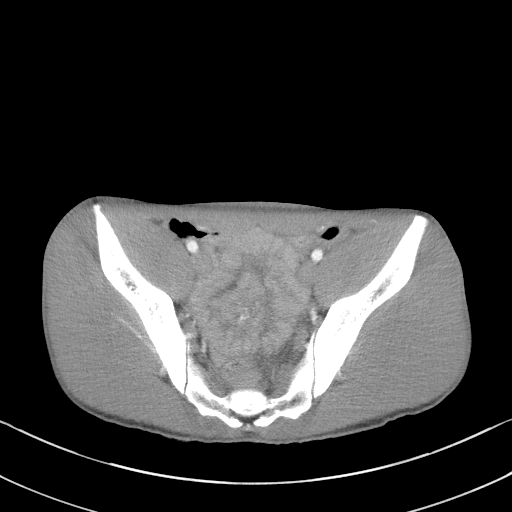
[im 38/87  soft-tissue]
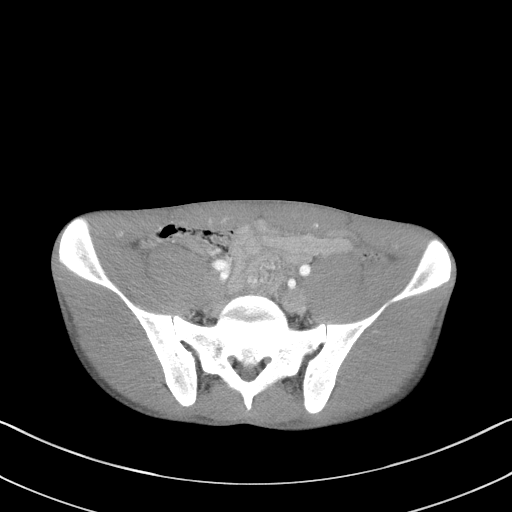
[im 45/87  soft-tissue]
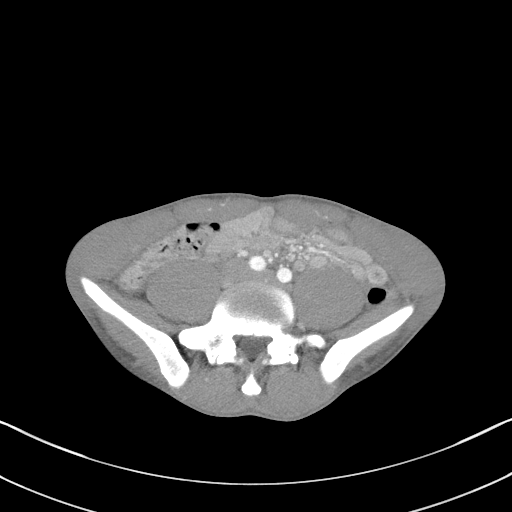
[im 49/87  soft-tissue]
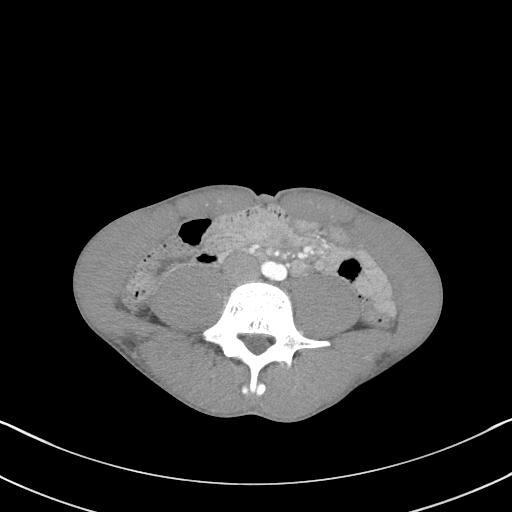
[im 56/87  soft-tissue]
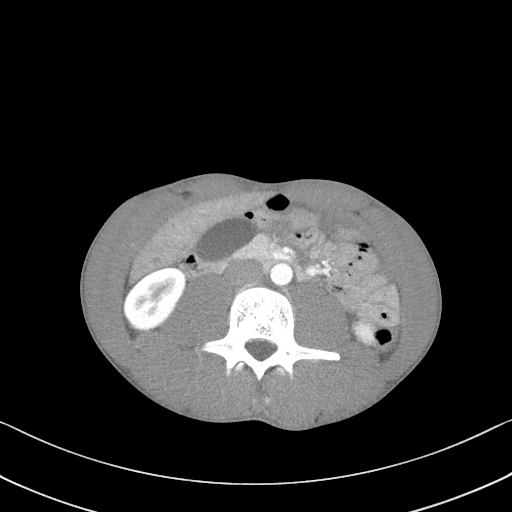
[im 56/87  bone]
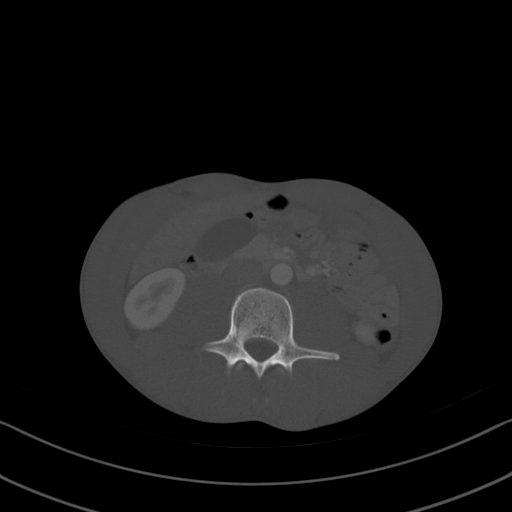
[im 62/87  soft-tissue]
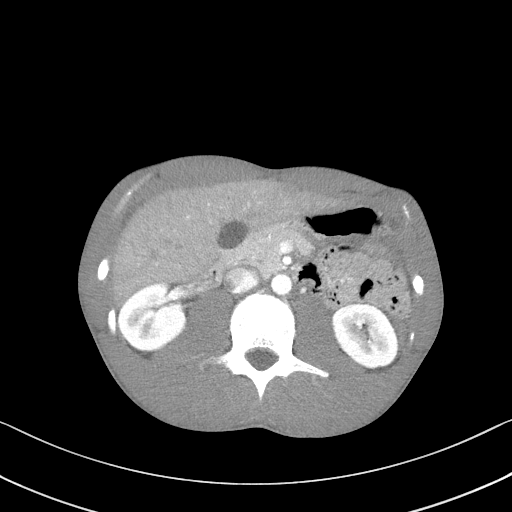
[im 69/87  soft-tissue]
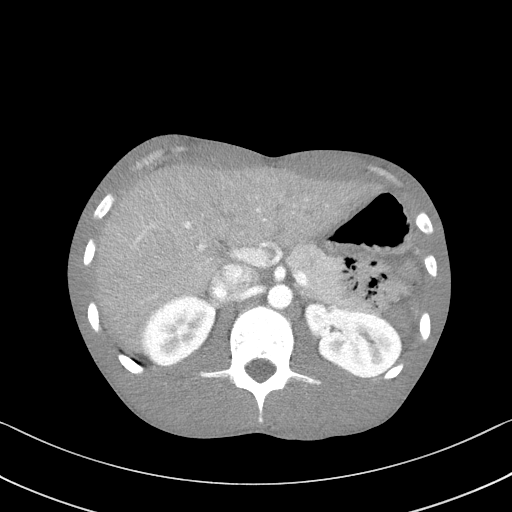
[im 76/87  soft-tissue]
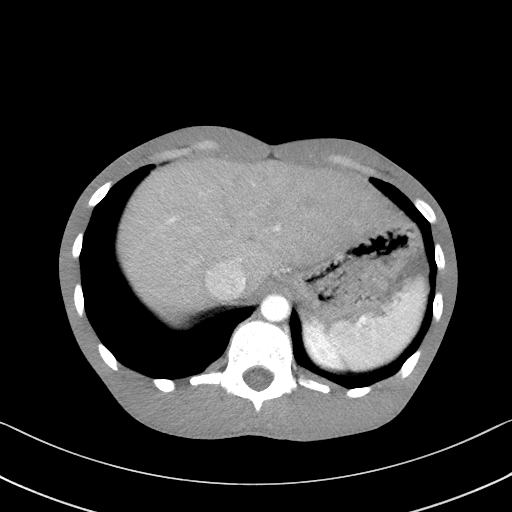
[im 83/87  soft-tissue]
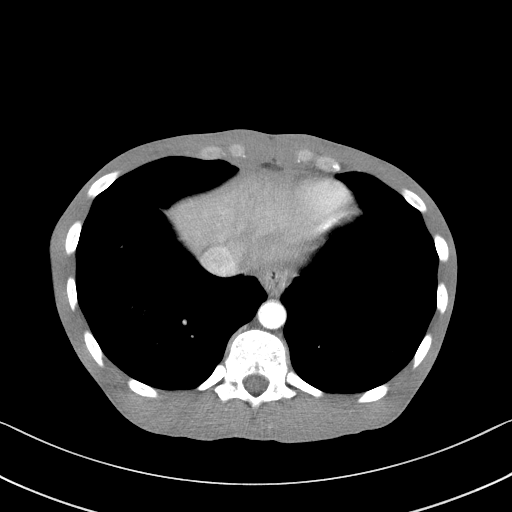

[Series 5: coronal st · coronal · 0.64mm/px · 3 of 100 slices shown]
[im 34/100  soft-tissue]
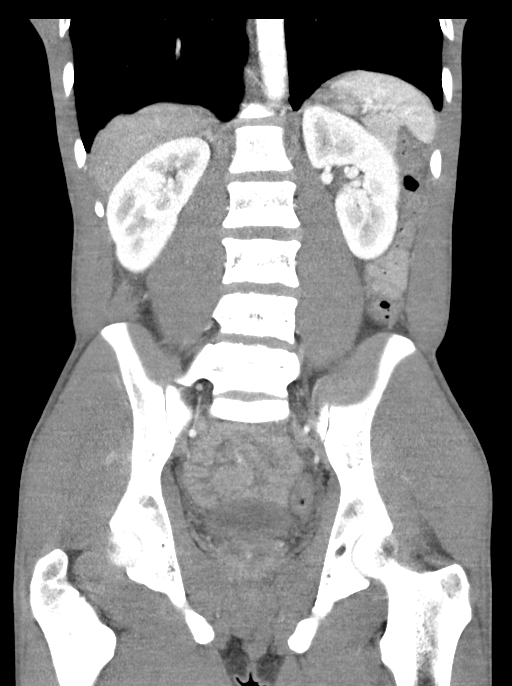
[im 45/100  soft-tissue]
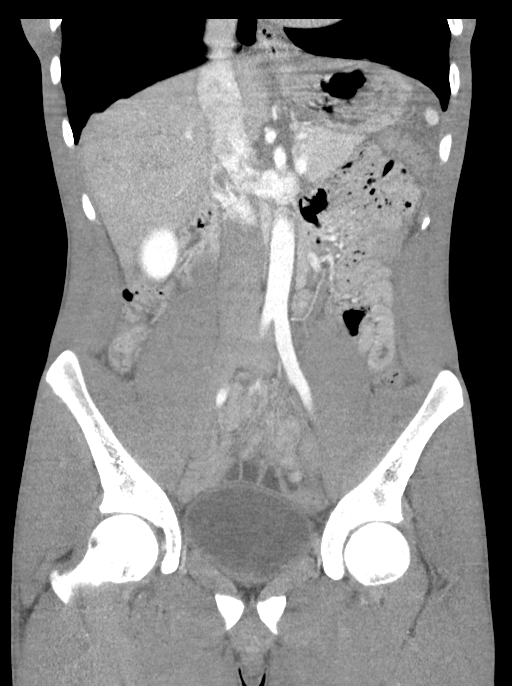
[im 56/100  soft-tissue]
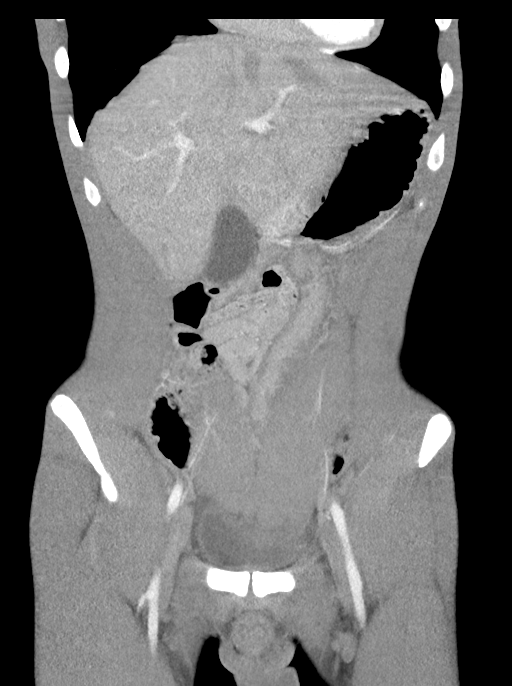

[16 of 46 positions shown; findings below may reference images not displayed]

FINDINGS: Lower chest: Lung bases are clear.

Hepatobiliary: Normal appearance of the liver and gallbladder. No
biliary dilatation.

Pancreas: Unremarkable. No pancreatic ductal dilatation or
surrounding inflammatory changes.

Spleen: Normal in size without focal abnormality.

Adrenals/Urinary Tract: Normal adrenal glands. Normal appearance of
both kidneys. Normal appearance of the urinary bladder.

Stomach/Bowel: Normal appearance of the stomach. There is no
evidence for acute bowel inflammation or obstruction. Difficult to
evaluate the bowel structures due to the lack of intra-abdominal
fat. Few fluid-filled loops of small bowel in the upper pelvis.

Vascular/Lymphatic: No abdominal or pelvic lymphadenopathy. Arterial
structures are normal. This is a predominantly arterial phase
examination. Limited evaluation of the venous structures.

Reproductive: Prostate is unremarkable.

Other: Trace free fluid in the pelvis.  Negative for free air.

Musculoskeletal: No acute bone abnormality.
IMPRESSION: 1. Trace fluid in the pelvis. Findings are nonspecific. Underlying
inflammatory process cannot be excluded. No focal areas of acute
inflammation in the abdomen or pelvis.
